# Patient Record
Sex: Female | Born: 1985 | Race: White | Hispanic: Yes | Marital: Married | Smoking: Never smoker
Health system: Southern US, Community
[De-identification: ages and names within clinical notes are randomized; demographics above are authoritative.]

## PROBLEM LIST (undated history)

## (undated) HISTORY — PX: ABDOMINAL SURGERY: SHX537

---

## 2015-09-06 ENCOUNTER — Encounter: Payer: Self-pay | Admitting: Emergency Medicine

## 2015-09-06 ENCOUNTER — Ambulatory Visit (INDEPENDENT_AMBULATORY_CARE_PROVIDER_SITE_OTHER): Payer: BLUE CROSS/BLUE SHIELD

## 2015-09-06 ENCOUNTER — Ambulatory Visit
Admission: EM | Admit: 2015-09-06 | Discharge: 2015-09-06 | Disposition: A | Discharge status: Other Acute Inpatient Hospital | Payer: BLUE CROSS/BLUE SHIELD | Attending: Family Medicine | Admitting: Family Medicine

## 2015-09-06 DIAGNOSIS — R109 Unspecified abdominal pain: Secondary | ICD-10-CM | POA: Diagnosis present

## 2015-09-06 DIAGNOSIS — K668 Other specified disorders of peritoneum: Secondary | ICD-10-CM | POA: Diagnosis not present

## 2015-09-06 DIAGNOSIS — Z905 Acquired absence of kidney: Secondary | ICD-10-CM | POA: Diagnosis not present

## 2015-09-06 DIAGNOSIS — R1013 Epigastric pain: Secondary | ICD-10-CM

## 2015-09-06 LAB — BASIC METABOLIC PANEL
ANION GAP: 5 (ref 5–15)
BUN: 9 mg/dL (ref 6–20)
CHLORIDE: 105 mmol/L (ref 101–111)
CO2: 25 mmol/L (ref 22–32)
Calcium: 9.3 mg/dL (ref 8.9–10.3)
Creatinine, Ser: 0.66 mg/dL (ref 0.44–1.00)
GFR calc Af Amer: >60 mL/min (ref >60)
Glucose, Bld: 98 mg/dL (ref 65–99)
POTASSIUM: 3.9 mmol/L (ref 3.5–5.1)
SODIUM: 135 mmol/L (ref 135–145)

## 2015-09-06 LAB — CBC WITH DIFFERENTIAL/PLATELET
BASOS ABS: 0 10*3/uL (ref 0–0.1)
Basophils Relative: 1 %
EOS ABS: 0.1 10*3/uL (ref 0–0.7)
EOS PCT: 2 %
HCT: 36.8 % (ref 35.0–47.0)
Hemoglobin: 12.1 g/dL (ref 12.0–16.0)
LYMPHS PCT: 29 %
Lymphs Abs: 1.6 10*3/uL (ref 1.0–3.6)
MCH: 28.8 pg (ref 26.0–34.0)
MCHC: 32.8 g/dL (ref 32.0–36.0)
MCV: 87.8 fL (ref 80.0–100.0)
Monocytes Absolute: 0.5 10*3/uL (ref 0.2–0.9)
Monocytes Relative: 9 %
Neutro Abs: 3.4 10*3/uL (ref 1.4–6.5)
Neutrophils Relative %: 59 %
PLATELETS: 247 10*3/uL (ref 150–440)
RBC: 4.2 MIL/uL (ref 3.80–5.20)
RDW: 12.7 % (ref 11.5–14.5)
WBC: 5.6 10*3/uL (ref 3.6–11.0)

## 2015-09-06 LAB — AMYLASE: AMYLASE: 58 U/L (ref 28–100)

## 2015-09-06 LAB — HCG, QUANTITATIVE, PREGNANCY

## 2015-09-06 LAB — LIPASE, BLOOD: LIPASE: 21 U/L (ref 11–51)

## 2015-09-06 MED ORDER — GI COCKTAIL ~~LOC~~
30.0000 mL | Freq: Once | ORAL | Status: AC
  Administered 2015-09-06: 30 mL via ORAL

## 2015-09-06 MED ORDER — KETOROLAC TROMETHAMINE 60 MG/2ML IM SOLN
60.0000 mg | Freq: Once | INTRAMUSCULAR | Status: AC
  Administered 2015-09-06: 60 mg via INTRAMUSCULAR

## 2015-09-06 NOTE — ED Notes (Signed)
Patient c/o pain in her upper mid abdomen since Monday.  Patient states that she had Laproscopic surgery done on Monday to have her left fallopian tube removed.  Patient denies N/V.  Patient denies fevers.

## 2015-09-06 NOTE — Discharge Instructions (Signed)
Your history of abdominal pain was concerning for several different possibilities. Your lab work was completely normal. No sign of infection. Your EKG was normal, and did not suggest any Heart or cardiac cause of your chest pain, we think this is radiating from your abdominal pain.  The X-ray of chest and abdomen, showed "trace amount of free air" in your upper abdomen, this is common after any abdominal surgery like you had, however it should have significantly improved or resolved by now, it does appear to be concerning that we cannot rule out a perforated bowel as a complication of your surgery. This would be an emergency cause of "free air in the abdomen", and the X-ray can't tell us any further information.  We strongly recommend an Abdominal / Pelvis CT Scan to evaluate for any perforated bowel or presence / source of the free air, this could be a surgical emergency if does not improve or gets worse.  Please go directly to an Emergency Department at [facility], [facility] or [place], because if this turns out to be free air from a perforation on the CT scan, this may require a surgery to fix it.  Additionally, recommend that you DISCONTINUE Ibuprofen . You may continue to take the Percocet as needed.

## 2015-09-06 NOTE — ED Provider Notes (Signed)
CSN: [id number]     Arrival date & time 09/06/15  1155 History   First MD Initiated Contact with Patient 09/06/15 1247     Chief Complaint  Patient presents with  . Abdominal Pain   History provided by the patient.  (Consider location/radiation/quality/duration/timing/severity/associated sxs/prior Treatment) HPI   Patient presents with epigastric abdominal pain for 3 days. Described abdominal pain with onset Tues 2/7 morning 1-2 hours after taking Ibuprofen 800 and Percocet 5/325 without any food (on empty stomach), describes pain as mostly constant "cramping, aching" that is worse with eating or drinking anything, worse with laying flat especially at night (admits pain radiates into chest with cramping and feels shortness of breath) improved with sitting up and resting. Last meal on Weds 2/8 PM, today she has not eaten anything as was "scared it made the pain worse". She did not take any more ibuprofen or percocet today. Significant recent history with elective GYN surgery Mon 2/6 for laparoscopic Left fallopian tube removal to try to get pregnant (performed by Dr Willaim Bane, [facility] in [place]), her incisions are healing well and she has had not problems (given ibuprofen and percocet as PRN for post-op pain) - Admits to constipation, last BM >3-4 days ago prior to surgery - Additionally last episode of abdominal pain not really similar to this, was >1 year ago in [place], she was found to have a piece of meat stuck in esophagus, had EGD at that time. - Denies any nausea, vomiting, hematemesis, dark stools or blood per rectum, HA, fever/chills, swollen abdomen  Patient's last menstrual period was 08/16/2015 (approximate).  History reviewed. No pertinent past medical history. Past Surgical History  Procedure Laterality Date  . Abdominal surgery     History reviewed. No pertinent family history. Social History  Substance Use Topics  . Smoking status: Never Smoker   .  Smokeless tobacco: None  . Alcohol Use: Yes   OB History    No data available     Review of Systems  See above HPI  Allergies  Review of patient's allergies indicates no known allergies.  Home Medications   Prior to Admission medications   Medication Sig Start Date End Date Taking? Authorizing Provider  ibuprofen (ADVIL,MOTRIN) 800 MG tablet Take 800 mg by mouth every 8 (eight) hours as needed.   Yes Historical Provider, MD  oxyCODONE-acetaminophen (PERCOCET/ROXICET) 5-325 MG tablet Take 1 tablet by mouth every 4 (four) hours as needed for severe pain.   Yes Historical Provider, MD   Meds Ordered and Administered this Visit   Medications  gi cocktail (Maalox,Lidocaine,Donnatal) (30 mLs Oral Given 09/06/15 1316)  ketorolac (TORADOL) injection 60 mg (60 mg Intramuscular Given 09/06/15 1612)    BP 109/47 mmHg  Pulse 58  Temp(Src) 97 F (36.1 C) (Tympanic)  Resp 16  Ht  (1.778 m)  Wt 158 lb (71.668 kg)  BMI 22.67 kg/m2  SpO2 100%  LMP 08/16/2015 (Approximate) No data found.   Physical Exam  Constitutional: She is oriented to person, place, and time. She appears well-developed and well-nourished. No distress.  29 yr Female, well-appearing, cooperative, mildly uncomfortable when laying down in exam table due to abdomen pain  HENT:  Head: Normocephalic and atraumatic.  Mouth/Throat: Oropharynx is clear and moist. No oropharyngeal exudate.  Eyes: Conjunctivae and EOM are normal. Pupils are equal, round, and reactive to light.  Neck: Normal range of motion. Neck supple.  Cardiovascular: Normal rate, regular rhythm, normal heart sounds and intact distal pulses.  No murmur heard. Pulmonary/Chest: Effort normal and breath sounds normal. No respiratory distress. She has no wheezes. She has no rales. She exhibits no tenderness (Non-reproducible chest tenderness).  Abdominal: Soft. Bowel sounds are normal. She exhibits no distension and no mass. There is tenderness (primarily  epigastric, some milder discomfort in mid abdomen near umbilicus but near lap incision sites). There is guarding (in post-op surgical laparscopic sites mostly over Left lower quadrant).  Laparoscopic incision sites healing well, x 3 periumbilical and bilateral lower quads, no erythema, edema, drainage, incisions well approximated  Musculoskeletal: Normal range of motion. She exhibits no edema.  Neurological: She is alert and oriented to person, place, and time.  Skin: Skin is warm and dry. No rash noted. She is not diaphoretic.  Nursing note and vitals reviewed.   ED Course  Procedures (including critical care time)  Labs Review Labs Reviewed  BASIC METABOLIC PANEL  CBC WITH DIFFERENTIAL/PLATELET  AMYLASE  LIPASE, BLOOD  HCG, QUANTITATIVE, PREGNANCY    Imaging Review Dg Abd Acute W/chest  09/06/2015  CLINICAL DATA:  Epigastric pain radiating to the chest. Cramping. Shortness of breath. Dizziness. Left salpingectomy 4 days ago. EXAM: DG ABDOMEN ACUTE W/ 1V CHEST COMPARISON:  None. FINDINGS: Subsegmental atelectasis along the left hemidiaphragm in the retrocardiac position. Cardiac and mediastinal contours normal. No pleural effusion identified. Trace amount of free intraperitoneal gas interposed between the liver the right hemidiaphragm, not necessarily abnormal 4 days postoperative. Upper normal amount of stool in the colon. No significant abnormal air- fluid levels in bowel. No dilated bowel observed. No significant abnormal calcifications are identified. 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 IMPRESSION: 1. Trace amount of free intraperitoneal gas between the liver and the right hemidiaphragm. This is not necessarily abnormal 4 days postoperative. In an open procedure, intraperitoneal gas can persist up to 10 days postoperative and still be normal. With laparoscopy/CO2  infiltration, this usually clears much sooner. If there was intraoperative concern for perforated viscus then careful follow up or CT may be warranted. 2. Subsegmental atelectasis in the left lower lobe. Electronically Signed   By: Gaylyn Rong M.D.   On: 09/06/2015 15:36     MDM   1. Epigastric abdominal pain   2. Free intraperitoneal air    29 yr female without significant PMH presents with acute epigastric abdominal pain x 3 days, onset 1-2 hr after Ibuprofen 800 / Percocet on empty stomach, also 1 day post-op Left lap fallopian tube removal (elective for improving conception). History of abdominal pain worse with food, laying flat, and radiating into chest most consistent with a gastritis from NSAID (no h/o alcohol use) vs GERD (no prior history), given concerns with chest pains when pain is worse, and recent abdominal surgery, cannot rule out unlikely but potential free air in abdomen. No significant cardiac risk factors. Will proceed with basic lab work-up BMET, CBC, EKG (given chest pain), and Acute Chest / Abd series X-ray to rule out free air.  UPDATE 1345 - Reviewed all lab results, unremarkable BMET (normal electrolytes) and CBC (normal WBC), EKG with Sinus Brady HR 51 with some non-specific T wave findings not consistent with ST-T ischemic changes. Pending Chest / Abd X-ray. Discussed with patient and nursing staff, patient is unclear if pregnancy test confirmed prior to her recent GYN L-fallopian tube surgical removal and also reported "cleaned out Right fallopian tube", proceeded to order a beta-HCG pregnancy lab test.  UPDATE 1545 - No improvement with GI cocktail (evidence to suggest less likely gastritis). Additional labs negative with lipase, amylase, beta-hcg pregnancy test is negative. Abd/Chest X-ray interpretation demonstrates trace amount of free air between liver and R-hemidiaphragm, per radiology this is expected post-op, however after lap procedure they would expect this  to have already resolved or be improved, NOT worsening pain. Recommended CT abdomen to evaluate for potential risk of perforated viscus as complication of recent surgery. Patient has not contacted her surgeon's office yet, they will attempt this. Discussed all results in detail with patient and her husband in room. Strongly recommended that patient go to an ED with surgery on call for evaluation with CT, offered [facility], [facility] or [facility] as closest available options, counseled on risks of perforation if not evaluated, she understood the importance of this and agrees to go, especially if symptoms worsening. Additionally ordered Toradol  IM x1 for pain, then discharged to go to ED for further work-up (no available CT here today). Recommend DC Ibuprofen 800 and can take Percocet PRN.   Smitty Cords, DO 09/06/15 [phone]

## 2015-09-06 NOTE — ED Provider Notes (Signed)
CSN: [id number]     Arrival date & time 09/06/15  1155 History   First MD Initiated Contact with Patient 09/06/15 1247     Chief Complaint  Patient presents with  . Abdominal Pain   Patient's here because of abdominal pain. She was seen back with me and Dr Cheron Schaumann [facility]). Please see Dr. Charm Rings note. Patient's here because of abdominal pain. She had a surgical procedure on Monday left nephrectomy and right fallopian tube plasty. She's had increasing abdominal paiin. Instead of contacting her surgeon she came here to be evaluated.   (Consider location/radiation/quality/duration/timing/severity/associated sxs/prior Treatment) HPI  History reviewed. No pertinent past medical history. Past Surgical History  Procedure Laterality Date  . Abdominal surgery     History reviewed. No pertinent family history. Social History  Substance Use Topics  . Smoking status: Never Smoker   . Smokeless tobacco: None  . Alcohol Use: Yes   OB History    No data available     Review of Systems  Allergies  Review of patient's allergies indicates no known allergies.  Home Medications   Prior to Admission medications   Medication Sig Start Date End Date Taking? Authorizing Provider  ibuprofen (ADVIL,MOTRIN) 800 MG tablet Take 800 mg by mouth every 8 (eight) hours as needed.   Yes Historical Provider, MD  oxyCODONE-acetaminophen (PERCOCET/ROXICET) 5-325 MG tablet Take 1 tablet by mouth every 4 (four) hours as needed for severe pain.   Yes Historical Provider, MD   Meds Ordered and Administered this Visit   Medications  gi cocktail (Maalox,Lidocaine,Donnatal) (30 mLs Oral Given 09/06/15 1316)  ketorolac (TORADOL) injection 60 mg (60 mg Intramuscular Given 09/06/15 1612)    BP 118/67 mmHg  Pulse 55  Temp(Src) 97.3 F (36.3 C) (Tympanic)  Resp 16  Ht 5\' 10"  (1.778 m)  Wt 158 lb (71.668 kg)  BMI 22.67 kg/m2  SpO2 100%  LMP 08/16/2015 (Approximate) No data found.   Physical Exam    Constitutional: She is oriented to person, place, and time. She appears well-developed and well-nourished. She appears distressed.  HENT:  Head: Normocephalic and atraumatic.  Eyes: Conjunctivae are normal. Pupils are equal, round, and reactive to light.  Cardiovascular: Normal rate and regular rhythm.   Pulmonary/Chest: Effort normal and breath sounds normal.  Abdominal: Soft. Bowel sounds are normal. There is no hepatosplenomegaly. There is generalized tenderness. There is no CVA tenderness. No hernia.    Patient had diffuse tenderness 1 epigastric intercourse over the left lower abdomen with open tube was removed on Monday.  Musculoskeletal: Normal range of motion. She exhibits no tenderness.  Lymphadenopathy:    She has no cervical adenopathy.  Neurological: She is alert and oriented to person, place, and time.  Skin: Skin is warm.  Psychiatric: She has a normal mood and affect. Her behavior is normal.  Vitals reviewed.   ED Course  Procedures (including critical care time)  Labs Review Labs Reviewed  BASIC METABOLIC PANEL  CBC WITH DIFFERENTIAL/PLATELET  AMYLASE  LIPASE, BLOOD  HCG, QUANTITATIVE, PREGNANCY    Imaging Review Dg Abd Acute W/chest  09/06/2015  CLINICAL DATA:  Epigastric pain radiating to the chest. Cramping. Shortness of breath. Dizziness. Left salpingectomy 4 days ago. EXAM: DG ABDOMEN ACUTE W/ 1V CHEST COMPARISON:  None. FINDINGS: Subsegmental atelectasis along the left hemidiaphragm in the retrocardiac position. Cardiac and mediastinal contours normal. No pleural effusion identified. Trace amount of free intraperitoneal gas interposed between the liver the right hemidiaphragm, not necessarily abnormal 4 days  postoperative. Upper normal amount of stool in the colon. No significant abnormal air- fluid levels in bowel. No dilated bowel observed. No significant abnormal calcifications are identified. 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 IMPRESSION: 1. Trace amount of free intraperitoneal gas between the liver and the right hemidiaphragm. This is not necessarily abnormal 4 days postoperative. In an open procedure, intraperitoneal gas can persist up to 10 days postoperative and still be normal. With laparoscopy/CO2 infiltration, this usually clears much sooner. If there was intraoperative concern for perforated viscus then careful follow up or CT may be warranted. 2. Subsegmental atelectasis in the left lower lobe. Electronically Signed   By: Gaylyn Rong M.D.   On: 09/06/2015 15:36     Visual Acuity Review  Right Eye Distance:   Left Eye Distance:   Bilateral Distance:    Right Eye Near:   Left Eye Near:    Bilateral Near:      Results for orders placed or performed during the hospital encounter of 09/06/15  Basic metabolic panel  Result Value Ref Range   Sodium 135 135 - 145 mmol/L   Potassium 3.9 3.5 - 5.1 mmol/L   Chloride 105 101 - 111 mmol/L   CO2 25 22 - 32 mmol/L   Glucose, Bld 98 65 - 99 mg/dL   BUN 9 6 - 20 mg/dL   Creatinine, Ser 1.61 0.44 - 1.00 mg/dL   Calcium 9.3 8.9 - 09.6 mg/dL   GFR calc non Af Amer >60 >60 mL/min   GFR calc Af Amer >60 >60 mL/min   Anion gap 5 5 - 15  CBC with Differential  Result Value Ref Range   WBC 5.6 3.6 - 11.0 K/uL   RBC 4.20 3.80 - 5.20 MIL/uL   Hemoglobin 12.1 12.0 - 16.0 g/dL   HCT 04.5 40.9 - 81.1 %   MCV 87.8 80.0 - 100.0 fL   MCH 28.8 26.0 - 34.0 pg   MCHC 32.8 32.0 - 36.0 g/dL   RDW 91.4 78.2 - 95.6 %   Platelets 247 150 - 440 K/uL   Neutrophils Relative % 59 %   Neutro Abs 3.4 1.4 - 6.5 K/uL   Lymphocytes Relative 29 %   Lymphs Abs 1.6 1.0 - 3.6 K/uL   Monocytes Relative 9 %   Monocytes Absolute 0.5 0.2 - 0.9 K/uL   Eosinophils Relative 2 %   Eosinophils Absolute 0.1 0 - 0.7 K/uL   Basophils Relative 1 %   Basophils Absolute 0.0 0 - 0.1 K/uL  Amylase    Result Value Ref Range   Amylase 58 28 - 100 U/L  Lipase, blood  Result Value Ref Range   Lipase 21 11 - 51 U/L  hCG, quantitative, pregnancy  Result Value Ref Range   hCG, Beta Chain, Quant, S <1 <5 mIU/mL     MDM   1. Epigastric abdominal pain   2. Free intraperitoneal air     We discussed with the patient and her husband finding by a radiologist suggesting that she go have a CT scan. We cannot do a CT scan here now explained to her that free air in the abdomen is ongoing get worse and is investigation needs to be done. Her husband wants to talk to the surgeon now and they can't reach him at this time. I suggested instead that they take her to [facility], [facility] or [facility] in [place]  or [facility] where there is a Careers adviser on call and ability to do a CT scan. Before she left she will be given a shot of Toradol for the pain and discomfort. The patient indicates that she is in a significant amount of pain that sounded Toradol injection will be given. She also reports that the GI cocktail did not really help which negates this could be coming from dyspepsia from overuse of anti-inflammatories. EKG given to patient husband with this recommendation still they go to an ED with the surgical team present.,  ED ECG REPORT I, Willye Javier H, the attending physician, personally viewed and interpreted this ECG.   Date: 09/06/2015  EKG Time:13:26:38  Rate: 51  Rhythm: sinus bradycardia  Axis: 64  Intervals:none  ST&T Change: inverted T wave.    Hassan Rowan, MD 09/06/15 [phone]

## 2016-09-17 IMAGING — CR DG ABDOMEN ACUTE W/ 1V CHEST
4 series · 4 of 4 positions shown · non-contrast
Comparison: None.

CLINICAL DATA: Epigastric pain radiating to the chest. Cramping.
Shortness of breath. Dizziness. Left salpingectomy 4 days ago.

EXAM:
DG ABDOMEN ACUTE W/ 1V CHEST

[chest pa]
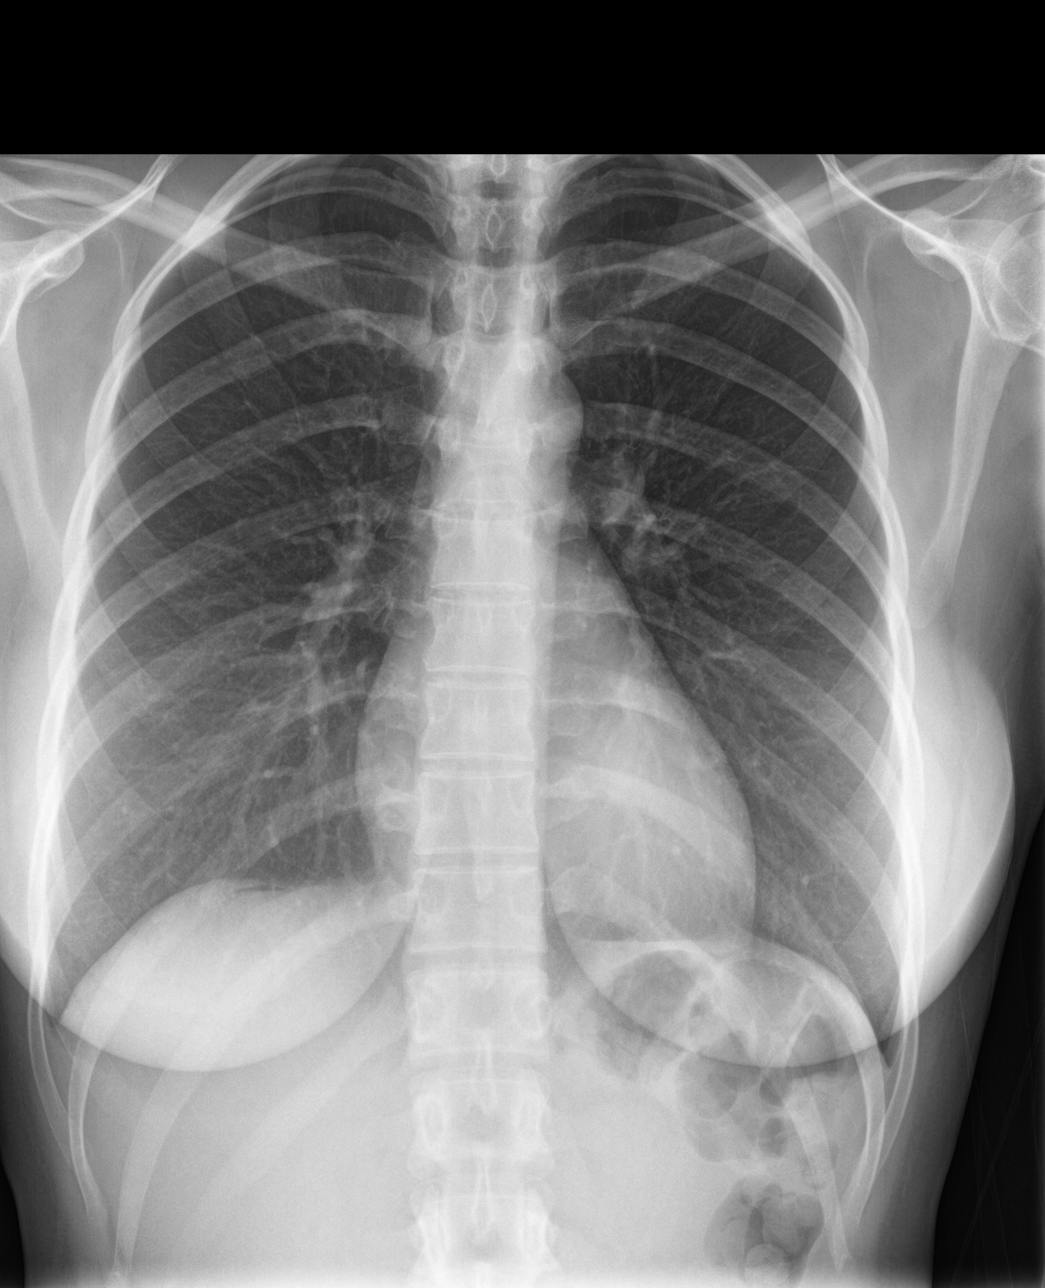

[abdomen erect]
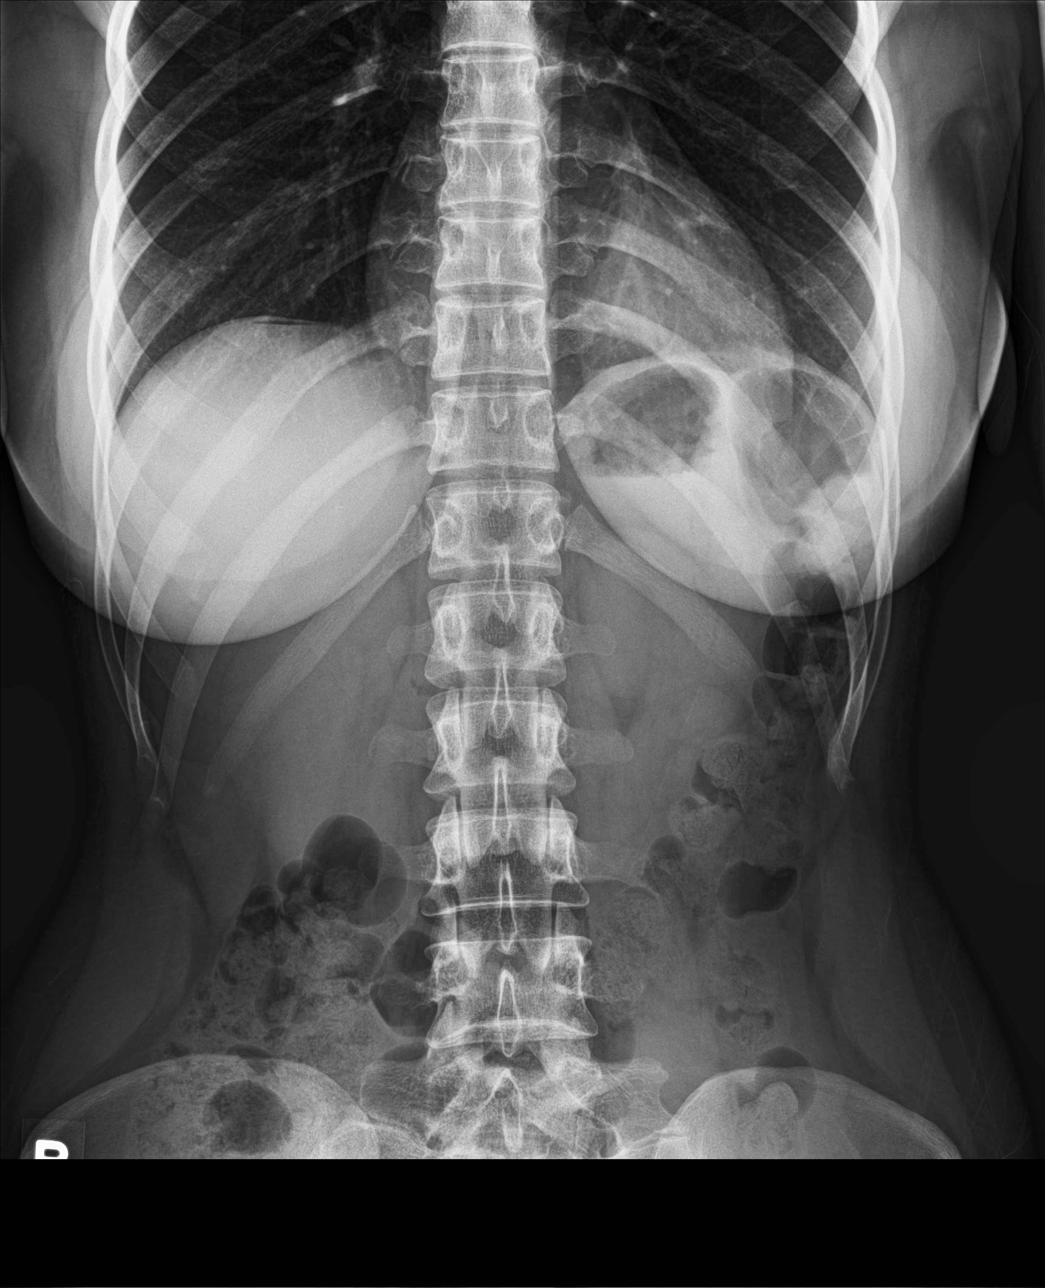

[abdomen supine (1 of 2)]
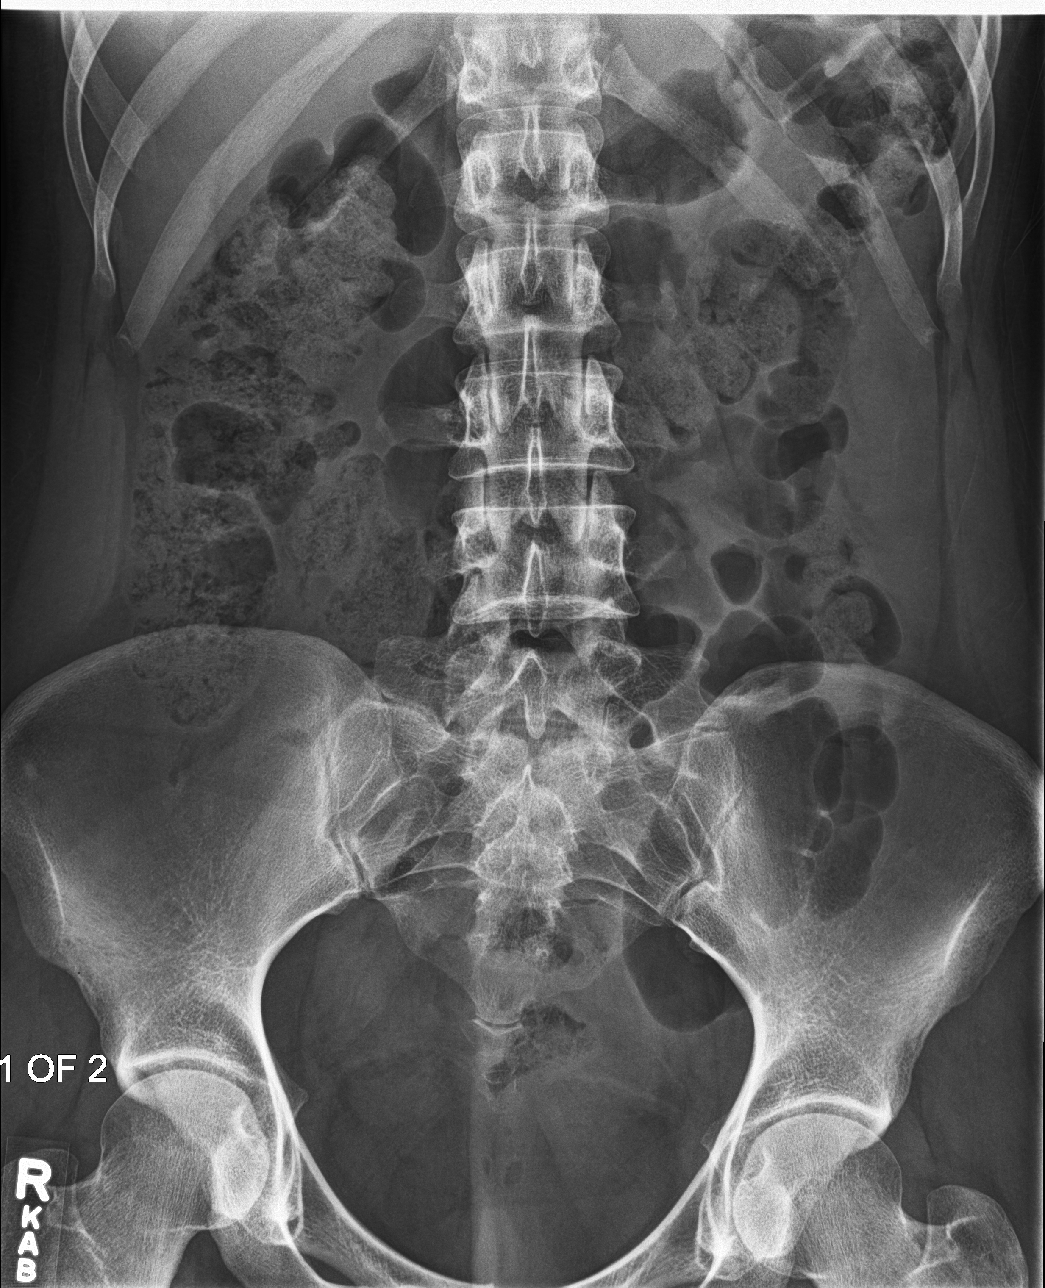

[abdomen supine (2 of 2)]
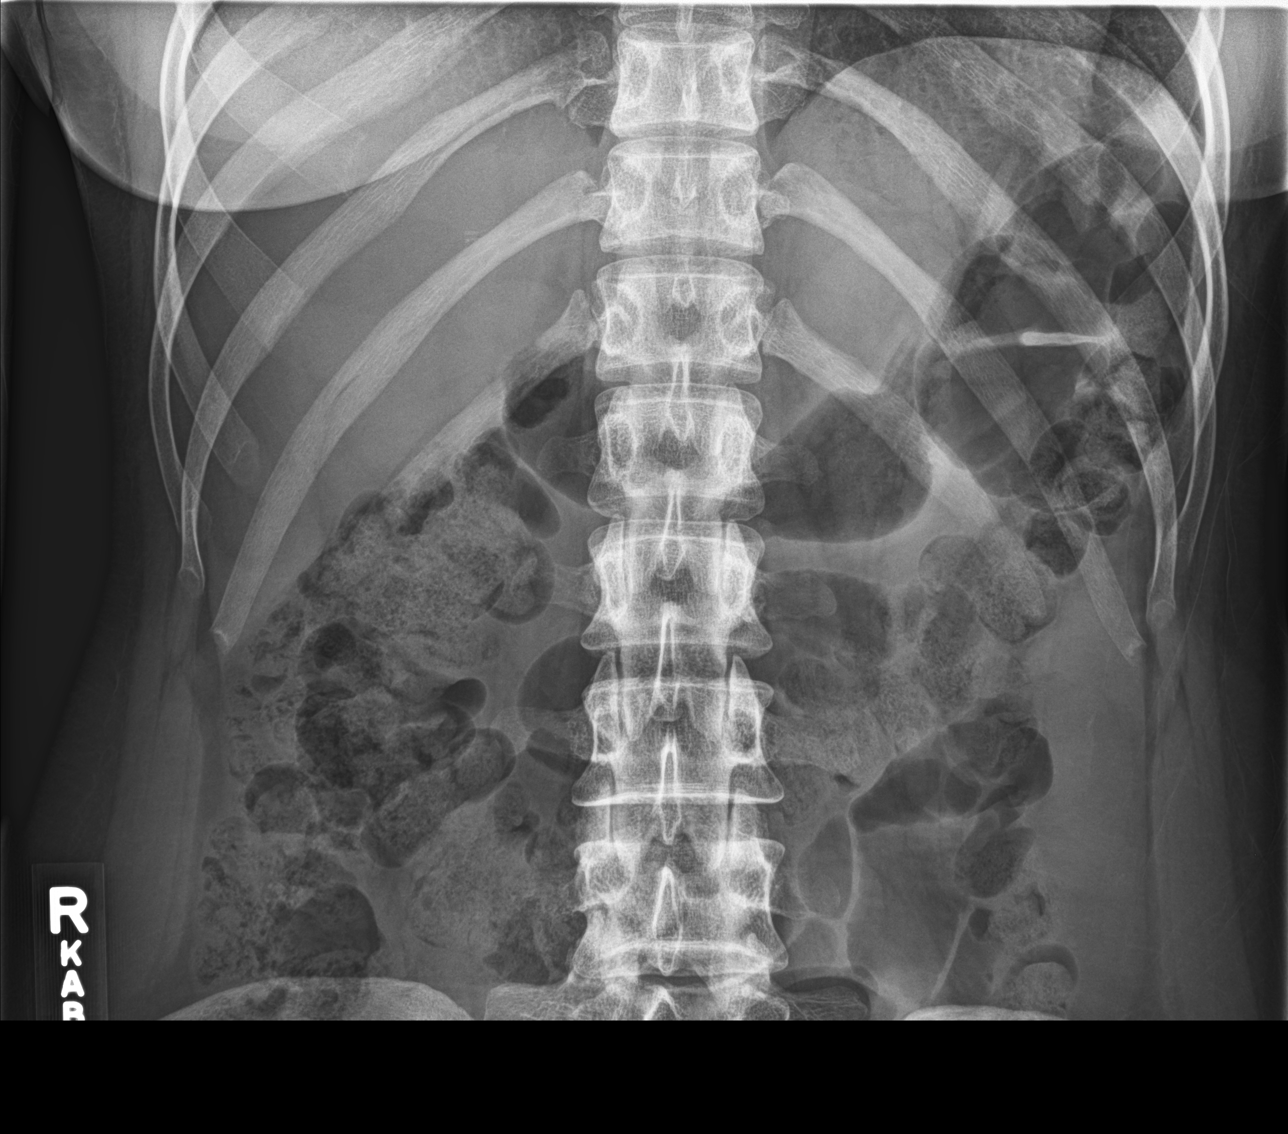

[4 of 4 positions shown; findings below may reference images not displayed]

FINDINGS: Subsegmental atelectasis along the left hemidiaphragm in the
retrocardiac position. Cardiac and mediastinal contours normal.

No pleural effusion identified.

Trace amount of free intraperitoneal gas interposed between the
liver the right hemidiaphragm, not necessarily abnormal 4 days
postoperative.

Upper normal amount of stool in the colon. No significant abnormal
air- fluid levels in bowel. No dilated bowel observed.

No significant abnormal calcifications are identified.

Transitional L5 vertebra with broad transverse processes pseudo
articulating with the sacrum without definite sclerosis to further
favor Bertolotti syndrome.
IMPRESSION: 1. Trace amount of free intraperitoneal gas between the liver and
the right hemidiaphragm. This is not necessarily abnormal 4 days
postoperative. In an open procedure, intraperitoneal gas can persist
up to 10 days postoperative and still be normal. With
laparoscopy/CO2 infiltration, this usually clears much sooner. If
there was intraoperative concern for perforated viscus then careful
follow up or CT may be warranted.
2. Subsegmental atelectasis in the left lower lobe.
